# Patient Record
Sex: Male | Born: 1985 | Race: White | Hispanic: No | Marital: Married | Smoking: Never smoker
Health system: Southern US, Community
[De-identification: ages and names within clinical notes are randomized; demographics above are authoritative.]

---

## 2015-10-06 DIAGNOSIS — L0100 Impetigo, unspecified: Secondary | ICD-10-CM | POA: Diagnosis not present

## 2015-10-06 DIAGNOSIS — B001 Herpesviral vesicular dermatitis: Secondary | ICD-10-CM | POA: Diagnosis not present

## 2015-10-23 DIAGNOSIS — H5213 Myopia, bilateral: Secondary | ICD-10-CM | POA: Diagnosis not present

## 2015-12-13 DIAGNOSIS — D225 Melanocytic nevi of trunk: Secondary | ICD-10-CM | POA: Diagnosis not present

## 2015-12-13 DIAGNOSIS — D2239 Melanocytic nevi of other parts of face: Secondary | ICD-10-CM | POA: Diagnosis not present

## 2015-12-13 DIAGNOSIS — D485 Neoplasm of uncertain behavior of skin: Secondary | ICD-10-CM | POA: Diagnosis not present

## 2016-01-11 DIAGNOSIS — B009 Herpesviral infection, unspecified: Secondary | ICD-10-CM | POA: Diagnosis not present

## 2016-01-11 DIAGNOSIS — D485 Neoplasm of uncertain behavior of skin: Secondary | ICD-10-CM | POA: Diagnosis not present

## 2016-02-07 DIAGNOSIS — Z23 Encounter for immunization: Secondary | ICD-10-CM | POA: Diagnosis not present

## 2016-02-16 DIAGNOSIS — J019 Acute sinusitis, unspecified: Secondary | ICD-10-CM | POA: Diagnosis not present

## 2016-02-23 DIAGNOSIS — R1012 Left upper quadrant pain: Secondary | ICD-10-CM | POA: Diagnosis not present

## 2016-02-23 DIAGNOSIS — R300 Dysuria: Secondary | ICD-10-CM | POA: Diagnosis not present

## 2016-04-15 DIAGNOSIS — D485 Neoplasm of uncertain behavior of skin: Secondary | ICD-10-CM | POA: Diagnosis not present

## 2016-04-15 DIAGNOSIS — B009 Herpesviral infection, unspecified: Secondary | ICD-10-CM | POA: Diagnosis not present

## 2016-04-16 DIAGNOSIS — K640 First degree hemorrhoids: Secondary | ICD-10-CM | POA: Diagnosis not present

## 2016-05-23 DIAGNOSIS — J0100 Acute maxillary sinusitis, unspecified: Secondary | ICD-10-CM | POA: Diagnosis not present

## 2016-11-10 DIAGNOSIS — H5213 Myopia, bilateral: Secondary | ICD-10-CM | POA: Diagnosis not present

## 2016-11-12 DIAGNOSIS — R509 Fever, unspecified: Secondary | ICD-10-CM | POA: Diagnosis not present

## 2016-11-14 DIAGNOSIS — R509 Fever, unspecified: Secondary | ICD-10-CM | POA: Diagnosis not present

## 2016-11-14 DIAGNOSIS — A932 Colorado tick fever: Secondary | ICD-10-CM | POA: Diagnosis not present

## 2016-12-16 DIAGNOSIS — D485 Neoplasm of uncertain behavior of skin: Secondary | ICD-10-CM | POA: Diagnosis not present

## 2017-01-28 DIAGNOSIS — Z23 Encounter for immunization: Secondary | ICD-10-CM | POA: Diagnosis not present

## 2017-04-30 DIAGNOSIS — Z1331 Encounter for screening for depression: Secondary | ICD-10-CM | POA: Diagnosis not present

## 2017-04-30 DIAGNOSIS — Z1339 Encounter for screening examination for other mental health and behavioral disorders: Secondary | ICD-10-CM | POA: Diagnosis not present

## 2017-04-30 DIAGNOSIS — Z6820 Body mass index (BMI) 20.0-20.9, adult: Secondary | ICD-10-CM | POA: Diagnosis not present

## 2017-04-30 DIAGNOSIS — Z0000 Encounter for general adult medical examination without abnormal findings: Secondary | ICD-10-CM | POA: Diagnosis not present

## 2017-08-26 DIAGNOSIS — Z6820 Body mass index (BMI) 20.0-20.9, adult: Secondary | ICD-10-CM | POA: Diagnosis not present

## 2017-08-26 DIAGNOSIS — Z0289 Encounter for other administrative examinations: Secondary | ICD-10-CM | POA: Diagnosis not present

## 2017-12-15 DIAGNOSIS — D485 Neoplasm of uncertain behavior of skin: Secondary | ICD-10-CM | POA: Diagnosis not present

## 2017-12-15 DIAGNOSIS — D225 Melanocytic nevi of trunk: Secondary | ICD-10-CM | POA: Diagnosis not present

## 2017-12-15 DIAGNOSIS — D2239 Melanocytic nevi of other parts of face: Secondary | ICD-10-CM | POA: Diagnosis not present

## 2018-01-04 DIAGNOSIS — K649 Unspecified hemorrhoids: Secondary | ICD-10-CM | POA: Diagnosis not present

## 2018-01-20 DIAGNOSIS — Z23 Encounter for immunization: Secondary | ICD-10-CM | POA: Diagnosis not present

## 2019-03-15 DIAGNOSIS — J029 Acute pharyngitis, unspecified: Secondary | ICD-10-CM | POA: Diagnosis not present

## 2019-06-13 DIAGNOSIS — J019 Acute sinusitis, unspecified: Secondary | ICD-10-CM | POA: Diagnosis not present

## 2019-06-13 DIAGNOSIS — Z20828 Contact with and (suspected) exposure to other viral communicable diseases: Secondary | ICD-10-CM | POA: Diagnosis not present

## 2019-06-13 DIAGNOSIS — J029 Acute pharyngitis, unspecified: Secondary | ICD-10-CM | POA: Diagnosis not present

## 2019-07-21 DIAGNOSIS — Z1331 Encounter for screening for depression: Secondary | ICD-10-CM | POA: Diagnosis not present

## 2019-07-21 DIAGNOSIS — Z0000 Encounter for general adult medical examination without abnormal findings: Secondary | ICD-10-CM | POA: Diagnosis not present

## 2019-07-21 DIAGNOSIS — Z13220 Encounter for screening for lipoid disorders: Secondary | ICD-10-CM | POA: Diagnosis not present

## 2019-07-21 DIAGNOSIS — Z681 Body mass index (BMI) 19 or less, adult: Secondary | ICD-10-CM | POA: Diagnosis not present

## 2019-07-21 DIAGNOSIS — K644 Residual hemorrhoidal skin tags: Secondary | ICD-10-CM | POA: Diagnosis not present

## 2019-10-24 DIAGNOSIS — J324 Chronic pansinusitis: Secondary | ICD-10-CM | POA: Diagnosis not present

## 2020-01-02 DIAGNOSIS — Z23 Encounter for immunization: Secondary | ICD-10-CM | POA: Diagnosis not present

## 2020-01-30 DIAGNOSIS — Z23 Encounter for immunization: Secondary | ICD-10-CM | POA: Diagnosis not present

## 2020-02-23 DIAGNOSIS — J069 Acute upper respiratory infection, unspecified: Secondary | ICD-10-CM | POA: Diagnosis not present

## 2020-02-23 DIAGNOSIS — M25722 Osteophyte, left elbow: Secondary | ICD-10-CM | POA: Diagnosis not present

## 2021-04-10 DIAGNOSIS — Z1331 Encounter for screening for depression: Secondary | ICD-10-CM | POA: Diagnosis not present

## 2021-04-10 DIAGNOSIS — Z0000 Encounter for general adult medical examination without abnormal findings: Secondary | ICD-10-CM | POA: Diagnosis not present

## 2021-04-10 DIAGNOSIS — Z13220 Encounter for screening for lipoid disorders: Secondary | ICD-10-CM | POA: Diagnosis not present

## 2021-04-10 DIAGNOSIS — Z6820 Body mass index (BMI) 20.0-20.9, adult: Secondary | ICD-10-CM | POA: Diagnosis not present

## 2021-04-29 ENCOUNTER — Other Ambulatory Visit: Payer: Self-pay

## 2021-04-29 ENCOUNTER — Encounter (HOSPITAL_COMMUNITY): Payer: Self-pay | Admitting: *Deleted

## 2021-04-29 ENCOUNTER — Emergency Department (HOSPITAL_COMMUNITY)
Admission: EM | Admit: 2021-04-29 | Discharge: 2021-04-30 | Disposition: A | Discharge status: Home / Self Care | Payer: BC Managed Care – PPO | Attending: Emergency Medicine | Admitting: Emergency Medicine

## 2021-04-29 DIAGNOSIS — K610 Anal abscess: Secondary | ICD-10-CM | POA: Diagnosis not present

## 2021-04-29 DIAGNOSIS — K611 Rectal abscess: Secondary | ICD-10-CM | POA: Diagnosis not present

## 2021-04-29 DIAGNOSIS — N3289 Other specified disorders of bladder: Secondary | ICD-10-CM | POA: Diagnosis not present

## 2021-04-29 DIAGNOSIS — K6289 Other specified diseases of anus and rectum: Secondary | ICD-10-CM | POA: Diagnosis not present

## 2021-04-29 NOTE — ED Provider Notes (Signed)
Emergency Medicine Provider Triage Evaluation Note  Jonathon Gardner , a 35 y.o. male  was evaluated in triage.  Pt complains of rectal abscess. 5 days. No rectal bleeding. No fever, chills. Sent by urgent care because they were not comfortable draining it. No recent abx. No hx of same.  Review of Systems  Positive: abscess Negative: Fever, chills  Physical Exam  BP 126/79 (BP Location: Right Arm)    Pulse 74    Temp 98 F (36.7 C) (Oral)    Resp 15    SpO2 100%  Gen:   Awake, no distress   Resp:  Normal effort  MSK:   Moves extremities without difficulty  Other:  Swollen mass noted perineum, no active draining  Medical Decision Making  Medically screening exam initiated at 5:05 PM.  Appropriate orders placed.  Jonathon Gardner was informed that the remainder of the evaluation will be completed by another provider, this initial triage assessment does not replace that evaluation, and the importance of remaining in the ED until their evaluation is complete.  abscess   Jonathon Gardner 04/29/21 1706    Jonathon Kaplan, MD 04/29/21 2329

## 2021-04-29 NOTE — ED Triage Notes (Signed)
Pt reports rectal pain since last week. Denies rectal bleeding. Went to an ucc today thinking that he had hemorrhoids, they sent him here for rectal abscess and possible drainage. Denies fever. No acute distress noted.

## 2021-04-30 ENCOUNTER — Emergency Department (HOSPITAL_COMMUNITY): Payer: BC Managed Care – PPO

## 2021-04-30 DIAGNOSIS — K611 Rectal abscess: Secondary | ICD-10-CM | POA: Diagnosis not present

## 2021-04-30 DIAGNOSIS — K610 Anal abscess: Secondary | ICD-10-CM | POA: Diagnosis not present

## 2021-04-30 DIAGNOSIS — K6289 Other specified diseases of anus and rectum: Secondary | ICD-10-CM | POA: Diagnosis not present

## 2021-04-30 DIAGNOSIS — N3289 Other specified disorders of bladder: Secondary | ICD-10-CM | POA: Diagnosis not present

## 2021-04-30 LAB — I-STAT CHEM 8, ED
BUN: 6 mg/dL (ref 6–20)
Calcium, Ion: 1.18 mmol/L (ref 1.15–1.40)
Chloride: 103 mmol/L (ref 98–111)
Creatinine, Ser: 0.7 mg/dL (ref 0.61–1.24)
Glucose, Bld: 101 mg/dL — ABNORMAL HIGH (ref 70–99)
HCT: 44 % (ref 39.0–52.0)
Hemoglobin: 15 g/dL (ref 13.0–17.0)
Potassium: 3.3 mmol/L — ABNORMAL LOW (ref 3.5–5.1)
Sodium: 140 mmol/L (ref 135–145)
TCO2: 27 mmol/L (ref 22–32)

## 2021-04-30 LAB — BASIC METABOLIC PANEL
Anion gap: 10 (ref 5–15)
BUN: 6 mg/dL (ref 6–20)
CO2: 24 mmol/L (ref 22–32)
Calcium: 8.8 mg/dL — ABNORMAL LOW (ref 8.9–10.3)
Chloride: 105 mmol/L (ref 98–111)
Creatinine, Ser: 0.76 mg/dL (ref 0.61–1.24)
GFR, Estimated: >60 mL/min (ref >60)
Glucose, Bld: 101 mg/dL — ABNORMAL HIGH (ref 70–99)
Potassium: 3.4 mmol/L — ABNORMAL LOW (ref 3.5–5.1)
Sodium: 139 mmol/L (ref 135–145)

## 2021-04-30 LAB — CBC WITH DIFFERENTIAL/PLATELET
Abs Immature Granulocytes: 0.03 10*3/uL (ref 0.00–0.07)
Basophils Absolute: 0 10*3/uL (ref 0.0–0.1)
Basophils Relative: 0 %
Eosinophils Absolute: 0.2 10*3/uL (ref 0.0–0.5)
Eosinophils Relative: 1 %
HCT: 41.4 % (ref 39.0–52.0)
Hemoglobin: 14 g/dL (ref 13.0–17.0)
Immature Granulocytes: 0 %
Lymphocytes Relative: 9 %
Lymphs Abs: 1.1 10*3/uL (ref 0.7–4.0)
MCH: 30 pg (ref 26.0–34.0)
MCHC: 33.8 g/dL (ref 30.0–36.0)
MCV: 88.8 fL (ref 80.0–100.0)
Monocytes Absolute: 0.9 10*3/uL (ref 0.1–1.0)
Monocytes Relative: 8 %
Neutro Abs: 10 10*3/uL — ABNORMAL HIGH (ref 1.7–7.7)
Neutrophils Relative %: 82 %
Platelets: 207 10*3/uL (ref 150–400)
RBC: 4.66 MIL/uL (ref 4.22–5.81)
RDW: 12.3 % (ref 11.5–15.5)
WBC: 12.3 10*3/uL — ABNORMAL HIGH (ref 4.0–10.5)
nRBC: 0 % (ref 0.0–0.2)

## 2021-04-30 MED ORDER — LIDOCAINE-EPINEPHRINE (PF) 2 %-1:200000 IJ SOLN
20.0000 mL | Freq: Once | INTRAMUSCULAR | Status: AC
  Administered 2021-04-30: 20 mL
  Filled 2021-04-30: qty 20

## 2021-04-30 MED ORDER — DOXYCYCLINE HYCLATE 100 MG PO CAPS: 100.0000 mg | ORAL_CAPSULE | Freq: Two times a day (BID) | ORAL | 0 refills | Status: DC

## 2021-04-30 MED ORDER — ACETAMINOPHEN 500 MG PO TABS
1000.0000 mg | ORAL_TABLET | Freq: Four times a day (QID) | ORAL | Status: DC | PRN
  Administered 2021-04-30: 02:00:00 1000 mg via ORAL
  Filled 2021-04-30: qty 2

## 2021-04-30 MED ORDER — IOHEXOL 300 MG/ML  SOLN
100.0000 mL | Freq: Once | INTRAMUSCULAR | Status: AC | PRN
  Administered 2021-04-30: 01:00:00 100 mL via INTRAVENOUS

## 2021-04-30 MED ORDER — LIDOCAINE-EPINEPHRINE-TETRACAINE (LET) TOPICAL GEL
3.0000 mL | Freq: Once | TOPICAL | Status: AC
  Administered 2021-04-30: 02:00:00 3 mL via TOPICAL
  Filled 2021-04-30: qty 3

## 2021-04-30 NOTE — ED Notes (Signed)
C/o headache spoke with provider

## 2021-04-30 NOTE — ED Notes (Signed)
Pt in ct at this time ° °

## 2021-04-30 NOTE — Discharge Instructions (Signed)
You were seen today for a perianal abscess.  This was drained.  Keep packing in place for 2 to 3 days.  Take antibiotics as prescribed.  Start a stool softener such as Colace to make sure your bowels are soft.  You may also start MiraLAX.  Follow-up with general surgery if you have any new or worsening symptoms.

## 2021-04-30 NOTE — ED Notes (Signed)
C/o rectal pain for at least a week. Seen at [facility] and sent here due to rectum abscess.

## 2021-04-30 NOTE — ED Notes (Signed)
PT back from CT

## 2021-04-30 NOTE — ED Notes (Signed)
Pt not in room at this time

## 2021-04-30 NOTE — ED Provider Notes (Signed)
[facility] EMERGENCY DEPARTMENT Provider Note   CSN: [id number] Arrival date & time: 04/29/21  1436     History Chief Complaint  Patient presents with   Abscess    Jonathon Gardner is a 35 y.o. male.  HPI     This is a 35 year old male who presents with concerns for perirectal abscess.  Patient was seen and evaluated in urgent care for the same.  Patient states that since mid last week he has noted pain in the rectal area.  He thought he had hemorrhoids.  He has not noted any drainage or bleeding.  He has never had perirectal abscess before.  He has not had any fevers or systemic symptoms.  Urgent care evaluated him and felt that he needed higher level of care.  He reports pain mostly with sitting.  History reviewed. No pertinent past medical history.  There are no problems to display for this patient.   History reviewed. No pertinent surgical history.     History reviewed. No pertinent family history.  Social History   Tobacco Use   Smoking status: Never   Smokeless tobacco: Never  Substance Use Topics   Alcohol use: Yes    Comment: socially   Drug use: Never    Home Medications Prior to Admission medications   Medication Sig Start Date End Date Taking? Authorizing Provider  doxycycline (VIBRAMYCIN) 100 MG capsule Take 1 capsule (100 mg total) by mouth 2 (two) times daily. 04/30/21  Yes Lysandra Loughmiller, Mayer Masker, MD    Allergies    Penicillins  Review of Systems   Review of Systems  Constitutional:  Negative for fever.  Respiratory:  Negative for shortness of breath.   Cardiovascular:  Negative for chest pain.  Gastrointestinal:  Positive for rectal pain. Negative for abdominal pain, diarrhea, nausea and vomiting.  Genitourinary:  Negative for dysuria.  All other systems reviewed and are negative.  Physical Exam Updated Vital Signs BP 123/81 (BP Location: Right Arm)    Pulse 79    Temp 98.1 F (36.7 C) (Oral)    Resp 16    Ht 1.854 m (6\' 1" )     Wt 74.8 kg    SpO2 100%    BMI 21.77 kg/m   Physical Exam Vitals and nursing note reviewed.  Constitutional:      Appearance: He is well-developed. He is not ill-appearing.  HENT:     Head: Normocephalic and atraumatic.     Nose: Nose normal.     Mouth/Throat:     Mouth: Mucous membranes are moist.  Eyes:     Pupils: Pupils are equal, round, and reactive to light.  Cardiovascular:     Rate and Rhythm: Normal rate and regular rhythm.  Pulmonary:     Effort: Pulmonary effort is normal. No respiratory distress.  Abdominal:     Tenderness: There is no abdominal tenderness.  Genitourinary:   Musculoskeletal:     Cervical back: Neck supple.  Lymphadenopathy:     Cervical: No cervical adenopathy.  Skin:    General: Skin is warm and dry.  Neurological:     Mental Status: He is alert and oriented to person, place, and time.  Psychiatric:        Mood and Affect: Mood normal.    ED Results / Procedures / Treatments   Labs (all labs ordered are listed, but only abnormal results are displayed) Labs Reviewed  BASIC METABOLIC PANEL - Abnormal; Notable for the following components:  Result Value   Potassium 3.4 (*)    Glucose, Bld 101 (*)    Calcium 8.8 (*)    All other components within normal limits  CBC WITH DIFFERENTIAL/PLATELET - Abnormal; Notable for the following components:   WBC 12.3 (*)    Neutro Abs 10.0 (*)    All other components within normal limits  I-STAT CHEM 8, ED - Abnormal; Notable for the following components:   Potassium 3.3 (*)    Glucose, Bld 101 (*)    All other components within normal limits  CBC WITH DIFFERENTIAL/PLATELET    EKG None  Radiology CT PELVIS W CONTRAST  Result Date: 04/30/2021 CLINICAL DATA:  Rectal pain for 1 week, initial encounter EXAM: CT PELVIS WITH CONTRAST TECHNIQUE: Multidetector CT imaging of the pelvis was performed using the standard protocol following the bolus administration of intravenous contrast. CONTRAST:   OMNIPAQUE IOHEXOL 300 MG/ML  SOLN COMPARISON:  None. FINDINGS: Urinary Tract: Bladder is well distended. Kidneys are not well visualized on this exam. Bowel:  No obstructive or inflammatory changes are noted. Vascular/Lymphatic: No pathologically enlarged lymph nodes. No significant vascular abnormality seen. Reproductive:  Prostate is within normal limits. Other: Along the left lateral aspect of the anus best seen on image number 53 of series 4 there is a 10 mm hypodensity with surrounding enhancement and inflammatory change consistent with a small perianal abscess. This measures approximately 15 mm in craniocaudad projection. Musculoskeletal: No suspicious bone lesions identified. IMPRESSION: Small left perianal abscess as described. Electronically Signed   By: Alcide Clever M.D.   On: 04/30/2021 01:30    Procedures .Marland KitchenIncision and Drainage  Date/Time: 04/30/2021 3:06 AM Performed by: Shon Baton, MD Authorized by: Shon Baton, MD   Consent:    Consent obtained:  Verbal Universal protocol:    Patient identity confirmed:  Verbally with patient Location:    Type:  Abscess   Size:  1 cmX 1 cm   Location:  Anogenital   Anogenital location:  Perirectal Sedation:    Sedation type:  None Anesthesia:    Anesthesia method:  Local infiltration   Local anesthetic:  Lidocaine 2% WITH epi Procedure type:    Complexity:  Simple Procedure details:    Ultrasound guidance: yes     Incision types:  Stab incision   Incision depth:  Dermal   Wound management:  Probed and deloculated   Drainage:  Purulent   Drainage amount:  Moderate   Packing materials:  1/4 in iodoform gauze Post-procedure details:    Procedure completion:  Tolerated   Medications Ordered in ED Medications  acetaminophen (TYLENOL) tablet 1,000 mg (1,000 mg Oral Given 04/30/21 0204)  lidocaine-EPINEPHrine-tetracaine (LET) topical gel (3 mLs Topical Given 04/30/21 0133)  iohexol (OMNIPAQUE) 300 MG/ML solution  100 mL (100 mLs Intravenous Contrast Given 04/30/21 0126)  lidocaine-EPINEPHrine (XYLOCAINE W/EPI) 2 %-1:200000 (PF) injection 20 mL (20 mLs Infiltration Given 04/30/21 0200)    ED Course  I have reviewed the triage vital signs and the nursing notes.  Pertinent labs & imaging results that were available during my care of the patient were reviewed by me and considered in my medical decision making (see chart for details).    MDM Rules/Calculators/A&P                          Patient presents with concerns for perianal/perirectal abscess.  Clinically this is what it appears to be.  No history of the  same.  He is clinically well-appearing.  He is afebrile.  Vital signs are reassuring.  I did obtain a CT to ensure no deep penetration of the abscess or fistulization.  There does not appear to be any complications.  This was drained at the bedside without difficulty.  Patient was given return precautions.  He was given surgery follow-up.  He was given care instructions.  After history, exam, and medical workup I feel the patient has been appropriately medically screened and is safe for discharge home. Pertinent diagnoses were discussed with the patient. Patient was given return precautions.      Final Clinical Impression(s) / ED Diagnoses Final diagnoses:  Perianal abscess    Rx / DC Orders ED Discharge Orders          Ordered    doxycycline (VIBRAMYCIN) 100 MG capsule  2 times daily        04/30/21 0256             Rickelle Sylvestre, Mayer Masker, MD 04/30/21 [phone]

## 2021-04-30 NOTE — ED Notes (Signed)
Ambulated to restroom without assistance 

## 2021-08-16 DIAGNOSIS — K610 Anal abscess: Secondary | ICD-10-CM | POA: Diagnosis not present

## 2021-08-16 DIAGNOSIS — Z6820 Body mass index (BMI) 20.0-20.9, adult: Secondary | ICD-10-CM | POA: Diagnosis not present

## 2021-08-21 DIAGNOSIS — K610 Anal abscess: Secondary | ICD-10-CM | POA: Diagnosis not present

## 2021-10-10 DIAGNOSIS — K649 Unspecified hemorrhoids: Secondary | ICD-10-CM | POA: Diagnosis not present

## 2021-10-10 DIAGNOSIS — K219 Gastro-esophageal reflux disease without esophagitis: Secondary | ICD-10-CM | POA: Diagnosis not present

## 2021-10-10 DIAGNOSIS — Z8719 Personal history of other diseases of the digestive system: Secondary | ICD-10-CM | POA: Diagnosis not present

## 2021-10-10 DIAGNOSIS — K5900 Constipation, unspecified: Secondary | ICD-10-CM | POA: Diagnosis not present

## 2021-10-17 DIAGNOSIS — K603 Anal fistula: Secondary | ICD-10-CM | POA: Diagnosis not present

## 2021-10-25 DIAGNOSIS — R0981 Nasal congestion: Secondary | ICD-10-CM | POA: Diagnosis not present

## 2021-10-25 DIAGNOSIS — J019 Acute sinusitis, unspecified: Secondary | ICD-10-CM | POA: Diagnosis not present

## 2021-11-20 DIAGNOSIS — K649 Unspecified hemorrhoids: Secondary | ICD-10-CM | POA: Diagnosis not present

## 2021-11-20 DIAGNOSIS — K5900 Constipation, unspecified: Secondary | ICD-10-CM | POA: Diagnosis not present

## 2021-11-20 DIAGNOSIS — K219 Gastro-esophageal reflux disease without esophagitis: Secondary | ICD-10-CM | POA: Diagnosis not present

## 2022-01-06 DIAGNOSIS — R0981 Nasal congestion: Secondary | ICD-10-CM | POA: Diagnosis not present

## 2022-01-06 DIAGNOSIS — J209 Acute bronchitis, unspecified: Secondary | ICD-10-CM | POA: Diagnosis not present

## 2022-01-06 DIAGNOSIS — R051 Acute cough: Secondary | ICD-10-CM | POA: Diagnosis not present

## 2022-01-12 DIAGNOSIS — J0100 Acute maxillary sinusitis, unspecified: Secondary | ICD-10-CM | POA: Diagnosis not present

## 2022-01-12 DIAGNOSIS — R051 Acute cough: Secondary | ICD-10-CM | POA: Diagnosis not present

## 2022-02-28 DIAGNOSIS — J309 Allergic rhinitis, unspecified: Secondary | ICD-10-CM | POA: Diagnosis not present

## 2022-02-28 DIAGNOSIS — Z6820 Body mass index (BMI) 20.0-20.9, adult: Secondary | ICD-10-CM | POA: Diagnosis not present

## 2022-04-26 DIAGNOSIS — L0291 Cutaneous abscess, unspecified: Secondary | ICD-10-CM | POA: Diagnosis not present

## 2022-04-26 DIAGNOSIS — L0293 Carbuncle, unspecified: Secondary | ICD-10-CM | POA: Diagnosis not present

## 2022-04-26 DIAGNOSIS — K610 Anal abscess: Secondary | ICD-10-CM | POA: Diagnosis not present

## 2022-08-21 DIAGNOSIS — K603 Anal fistula: Secondary | ICD-10-CM | POA: Diagnosis not present

## 2022-09-12 DIAGNOSIS — K603 Anal fistula: Secondary | ICD-10-CM | POA: Diagnosis not present

## 2022-09-12 DIAGNOSIS — K600 Acute anal fissure: Secondary | ICD-10-CM | POA: Diagnosis not present

## 2022-09-12 DIAGNOSIS — K610 Anal abscess: Secondary | ICD-10-CM | POA: Diagnosis not present

## 2022-09-17 DIAGNOSIS — K603 Anal fistula: Secondary | ICD-10-CM | POA: Diagnosis not present

## 2022-09-17 DIAGNOSIS — Z09 Encounter for follow-up examination after completed treatment for conditions other than malignant neoplasm: Secondary | ICD-10-CM | POA: Diagnosis not present

## 2022-10-15 DIAGNOSIS — Z09 Encounter for follow-up examination after completed treatment for conditions other than malignant neoplasm: Secondary | ICD-10-CM | POA: Diagnosis not present

## 2022-10-15 DIAGNOSIS — K603 Anal fistula: Secondary | ICD-10-CM | POA: Diagnosis not present

## 2022-11-12 DIAGNOSIS — K603 Anal fistula: Secondary | ICD-10-CM | POA: Diagnosis not present

## 2022-11-12 DIAGNOSIS — Z09 Encounter for follow-up examination after completed treatment for conditions other than malignant neoplasm: Secondary | ICD-10-CM | POA: Diagnosis not present

## 2022-12-24 DIAGNOSIS — K603 Anal fistula: Secondary | ICD-10-CM | POA: Diagnosis not present

## 2023-01-28 DIAGNOSIS — K603 Anal fistula: Secondary | ICD-10-CM | POA: Diagnosis not present

## 2023-02-12 DIAGNOSIS — K603 Anal fistula, unspecified: Secondary | ICD-10-CM | POA: Diagnosis not present

## 2023-02-25 DIAGNOSIS — K603 Anal fistula, unspecified: Secondary | ICD-10-CM | POA: Diagnosis not present

## 2023-04-08 DIAGNOSIS — Z09 Encounter for follow-up examination after completed treatment for conditions other than malignant neoplasm: Secondary | ICD-10-CM | POA: Diagnosis not present

## 2023-04-08 DIAGNOSIS — K603 Anal fistula, unspecified: Secondary | ICD-10-CM | POA: Diagnosis not present

## 2023-05-10 IMAGING — CT CT PELVIS W/ CM
2 of 3 series · 17 of 46 positions shown, 19 images · IV contrast (omnipaque)
Comparison: None.

CLINICAL DATA: Rectal pain for 1 week, initial encounter

EXAM:
CT PELVIS WITH CONTRAST
TECHNIQUE: Multidetector CT imaging of the pelvis was performed using the
standard protocol following the bolus administration of intravenous
contrast.
CONTRAST:  100mL OMNIPAQUE IOHEXOL 300 MG/ML  SOLN

[Series 4: pelvis with 5.0 · axial · 0.93mm/px · z∈[+599,+889]mm · 14 of 68 slices shown, 16 images]
[im 5/68  soft-tissue]
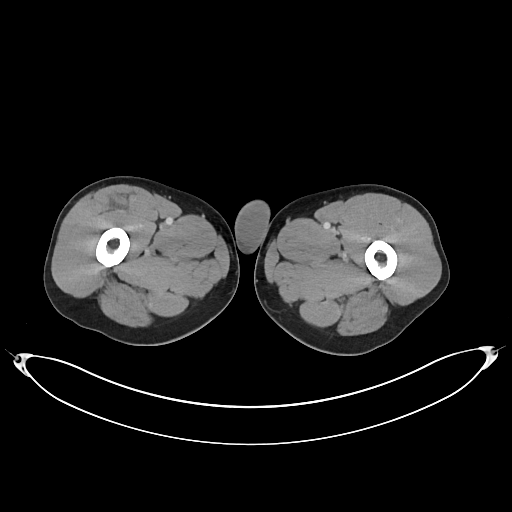
[im 5/68  bone]
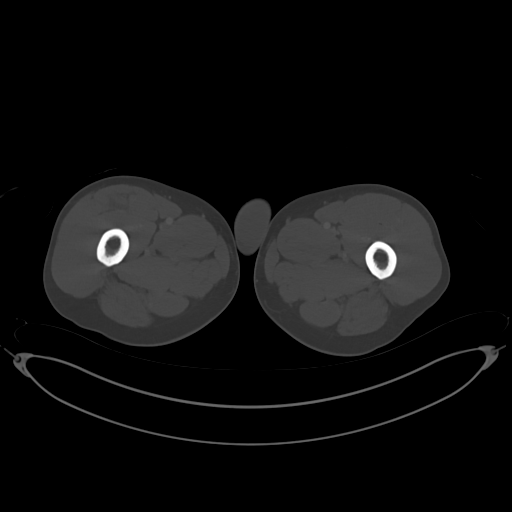
[im 9/68  soft-tissue]
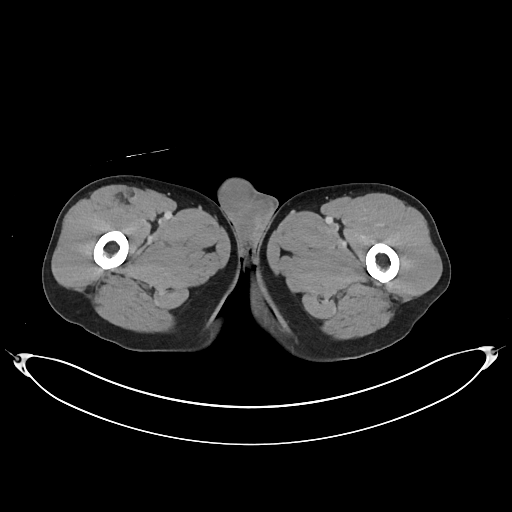
[im 13/68  soft-tissue]
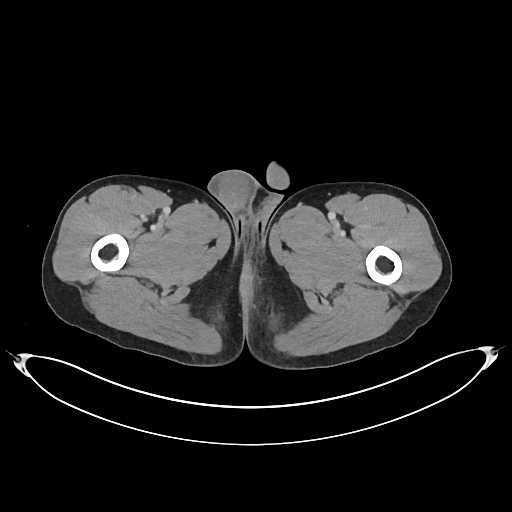
[im 18/68  soft-tissue]
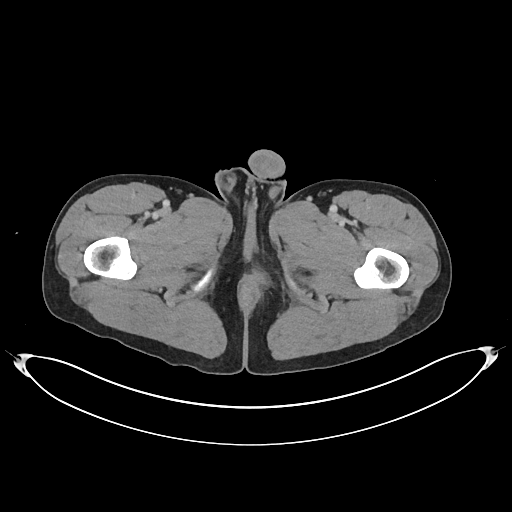
[im 22/68  soft-tissue]
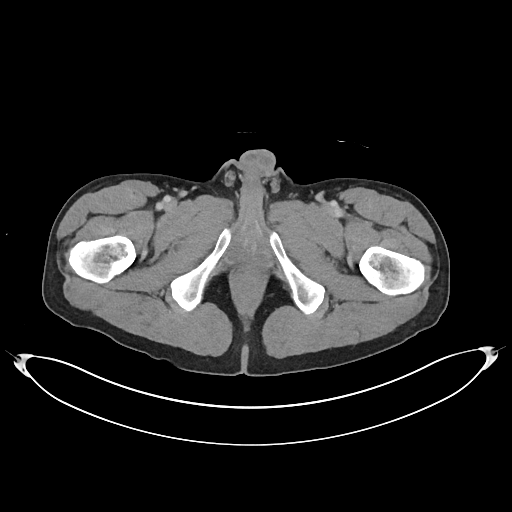
[im 26/68  soft-tissue]
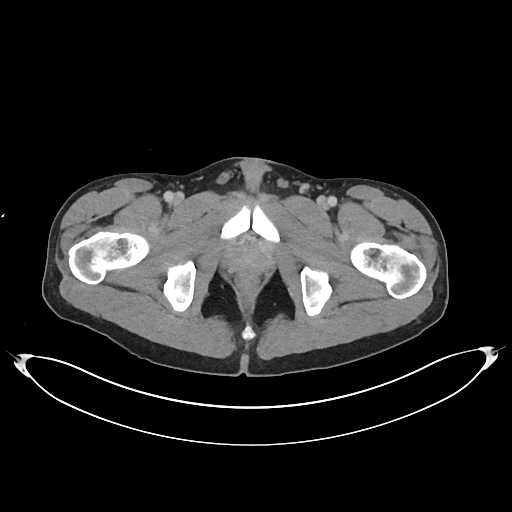
[im 31/68  soft-tissue]
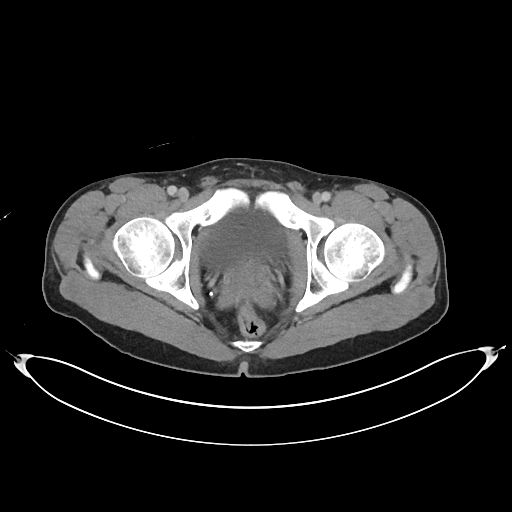
[im 37/68  soft-tissue]
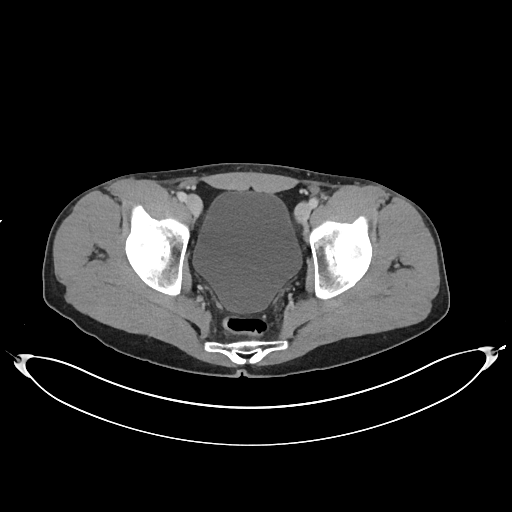
[im 42/68  soft-tissue]
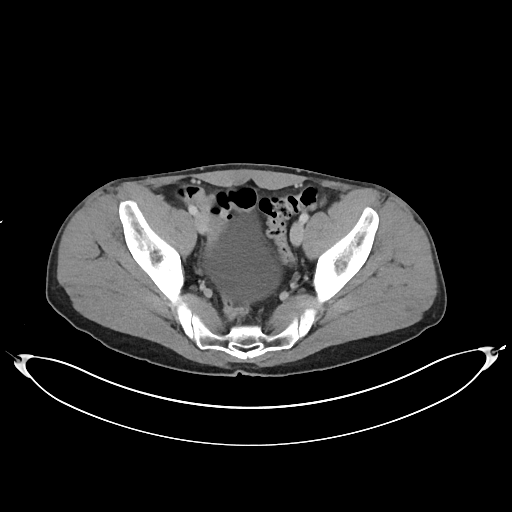
[im 42/68  bone]
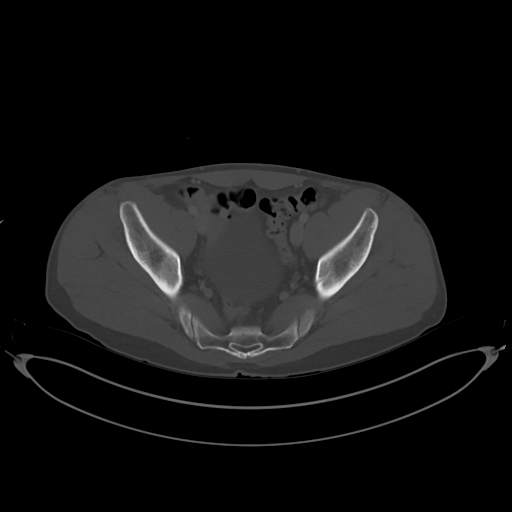
[im 46/68  soft-tissue]
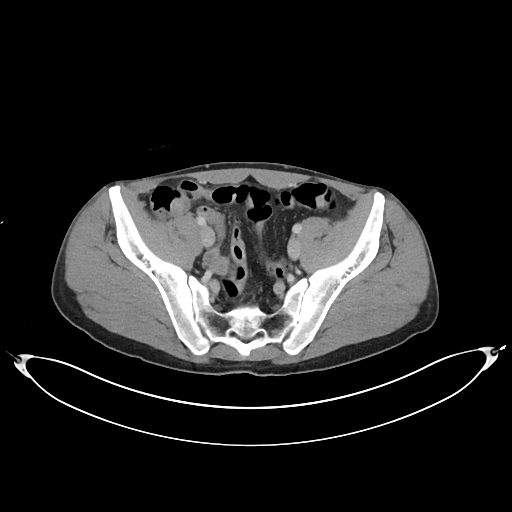
[im 50/68  soft-tissue]
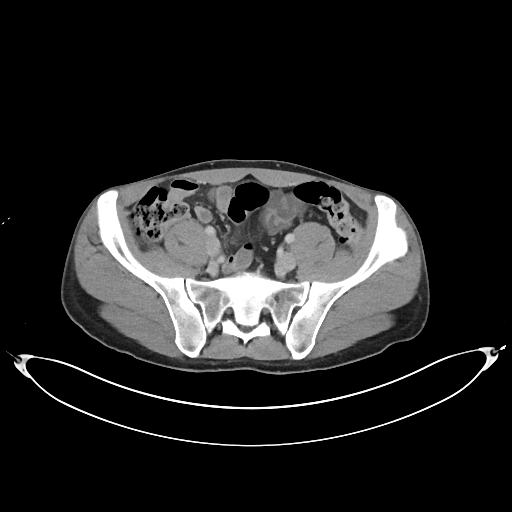
[im 55/68  soft-tissue]
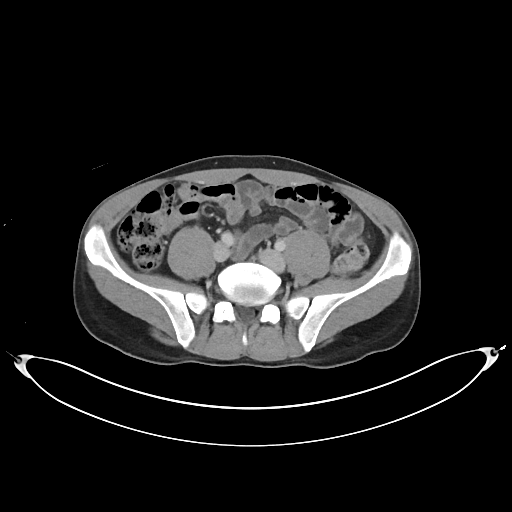
[im 59/68  soft-tissue]
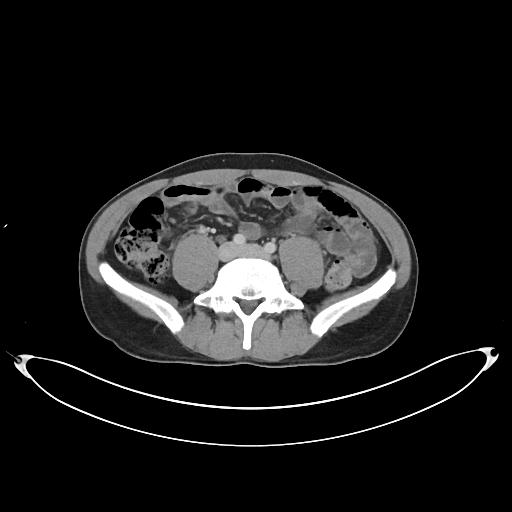
[im 63/68  soft-tissue]
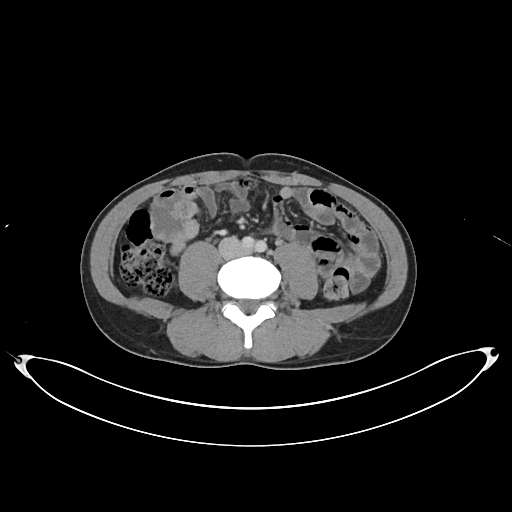

[Series 6: pelvis with 2.0 cor · coronal · 0.67mm/px · 3 of 142 slices shown]
[im 48/142  soft-tissue]
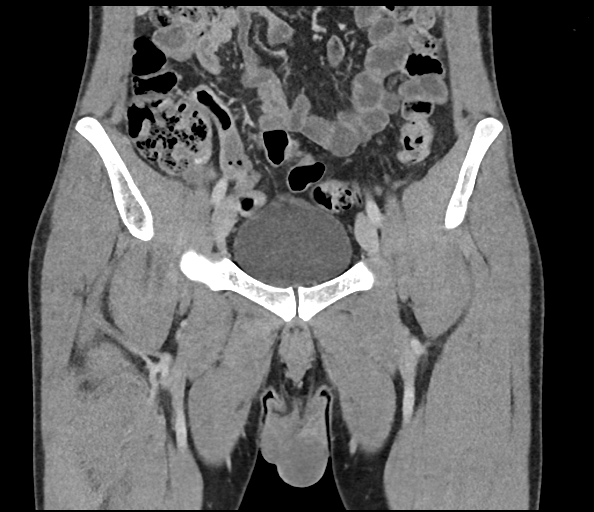
[im 63/142  soft-tissue]
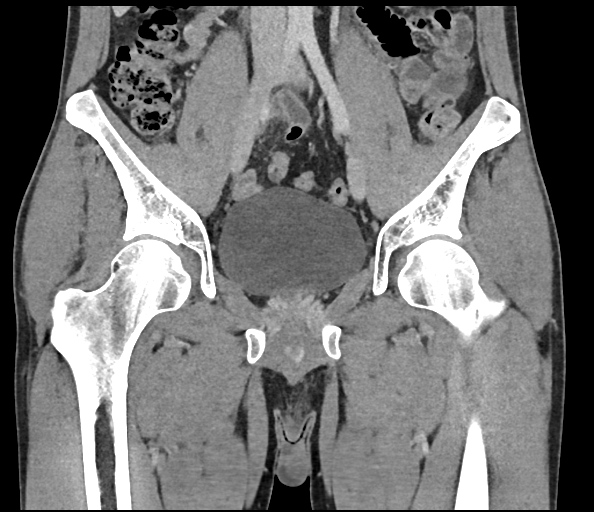
[im 79/142  soft-tissue]
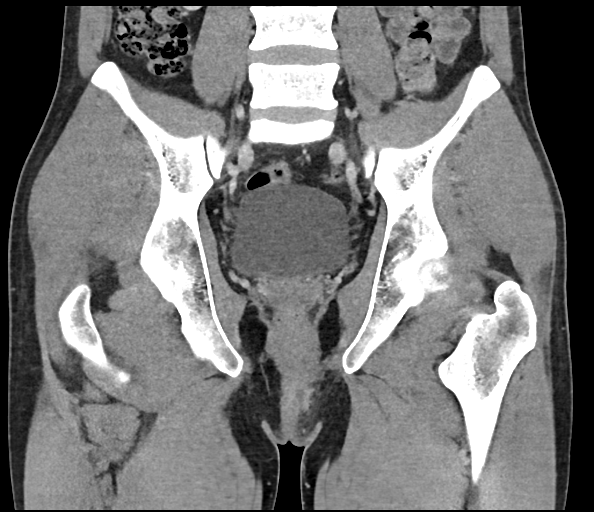

[17 of 46 positions shown; findings below may reference images not displayed]

FINDINGS: Urinary Tract: Bladder is well distended. Kidneys are not well
visualized on this exam.

Bowel:  No obstructive or inflammatory changes are noted.

Vascular/Lymphatic: No pathologically enlarged lymph nodes. No
significant vascular abnormality seen.

Reproductive:  Prostate is within normal limits.

Other: Along the left lateral aspect of the anus best seen on image
number 53 of series 4 there is a 10 mm hypodensity with surrounding
enhancement and inflammatory change consistent with a small perianal
abscess. This measures approximately 15 mm in craniocaudad
projection.

Musculoskeletal: No suspicious bone lesions identified.
IMPRESSION: Small left perianal abscess as described.

## 2023-05-14 DIAGNOSIS — K603 Anal fistula, unspecified: Secondary | ICD-10-CM | POA: Diagnosis not present

## 2023-06-03 DIAGNOSIS — K603 Anal fistula, unspecified: Secondary | ICD-10-CM | POA: Diagnosis not present

## 2023-06-11 DIAGNOSIS — K603 Anal fistula, unspecified: Secondary | ICD-10-CM | POA: Diagnosis not present

## 2023-06-14 DIAGNOSIS — R1084 Generalized abdominal pain: Secondary | ICD-10-CM | POA: Diagnosis not present

## 2023-06-16 DIAGNOSIS — R1084 Generalized abdominal pain: Secondary | ICD-10-CM | POA: Diagnosis not present

## 2023-06-16 DIAGNOSIS — Z6821 Body mass index (BMI) 21.0-21.9, adult: Secondary | ICD-10-CM | POA: Diagnosis not present

## 2023-06-25 DIAGNOSIS — K603 Anal fistula, unspecified: Secondary | ICD-10-CM | POA: Diagnosis not present

## 2023-06-25 DIAGNOSIS — Z09 Encounter for follow-up examination after completed treatment for conditions other than malignant neoplasm: Secondary | ICD-10-CM | POA: Diagnosis not present

## 2024-02-06 ENCOUNTER — Ambulatory Visit (HOSPITAL_BASED_OUTPATIENT_CLINIC_OR_DEPARTMENT_OTHER)
Admission: EM | Admit: 2024-02-06 | Discharge: 2024-02-06 | Disposition: A | Discharge status: Home / Self Care | Attending: Family Medicine | Admitting: Family Medicine

## 2024-02-06 ENCOUNTER — Encounter (HOSPITAL_BASED_OUTPATIENT_CLINIC_OR_DEPARTMENT_OTHER): Payer: Self-pay | Admitting: Emergency Medicine

## 2024-02-06 DIAGNOSIS — J3489 Other specified disorders of nose and nasal sinuses: Secondary | ICD-10-CM

## 2024-02-06 DIAGNOSIS — J0140 Acute pansinusitis, unspecified: Secondary | ICD-10-CM | POA: Diagnosis not present

## 2024-02-06 MED ORDER — DOXYCYCLINE HYCLATE 100 MG PO CAPS: 100.0000 mg | ORAL_CAPSULE | Freq: Two times a day (BID) | ORAL | 0 refills | Status: AC

## 2024-02-06 MED ORDER — TRIAMCINOLONE ACETONIDE 40 MG/ML IJ SUSP
40.0000 mg | Freq: Once | INTRAMUSCULAR | Status: AC
  Administered 2024-02-06: 40 mg via INTRAMUSCULAR

## 2024-02-06 NOTE — Discharge Instructions (Addendum)
 Sinusitis with sinus pressure: Kenalog 40 mg injection in the office (this is a steroid injection).  Doxycycline  100 mg twice daily.  Use plain Mucinex to make nasal secretions thinner.  Discontinue use of OTC Sudafed.  Get plenty of fluids and rest.  Encouraged use of sinus rinse bottle kit twice daily.  Follow-up if symptoms do not improve, worsen or new symptoms occur.

## 2024-02-06 NOTE — ED Triage Notes (Addendum)
 Pt st's he has a sinus infection.  C/O nasal congestion with yellow mucus and slight cough  Pt st's he has been taking Sudafed with only little relief  St's he feels pressure under his eyes

## 2024-02-06 NOTE — ED Provider Notes (Signed)
 [facility]    CSN: [id number] Arrival date & time: 02/06/24  9160      History   Chief Complaint Chief Complaint  Patient presents with   Nasal congestion    HPI Jonathon Gardner is a 38 y.o. male.   38 year old male with report of runny nose, cough and congestion since approximately 01/10/2024 or earlier.  He has a tendency to end up with sinus infections.  He has used over-the-counter decongestants and acetaminophen  ibuprofen and Sudafed.  Symptoms have persisted and worsened.  He is getting yellow-brown mucus when he blows his nose.  He has had persistent facial pain or sinus pressure since approximately 01/24/2024 or earlier.  He feels like it is a sinus infection and he is going to need some antibiotics because nothing else has helped.     History reviewed. No pertinent past medical history.  There are no active problems to display for this patient.   History reviewed. No pertinent surgical history.     Home Medications    Prior to Admission medications   Medication Sig Start Date End Date Taking? Authorizing Provider  doxycycline  (VIBRAMYCIN ) 100 MG capsule Take 1 capsule (100 mg total) by mouth 2 (two) times daily for 10 days. 02/06/24 02/16/24 Yes Ival Domino, FNP    Family History History reviewed. No pertinent family history.  Social History Social History   Tobacco Use   Smoking status: Never   Smokeless tobacco: Never  Vaping Use   Vaping status: Never Used  Substance Use Topics   Alcohol use: Yes    Comment: socially   Drug use: Never     Allergies   Penicillins   Review of Systems Review of Systems  Constitutional:  Negative for chills and fever.  HENT:  Positive for congestion, postnasal drip, rhinorrhea, sinus pressure and sinus pain. Negative for ear pain and sore throat.   Eyes:  Negative for pain and visual disturbance.  Respiratory:  Positive for cough.   Cardiovascular:  Negative for chest pain and palpitations.   Gastrointestinal:  Negative for abdominal pain, constipation, diarrhea, nausea and vomiting.  Genitourinary:  Negative for dysuria and hematuria.  Musculoskeletal:  Negative for arthralgias and back pain.  Skin:  Negative for color change and rash.  Neurological:  Negative for seizures and syncope.  All other systems reviewed and are negative.    Physical Exam Triage Vital Signs ED Triage Vitals  Encounter Vitals Group     BP 02/06/24 0850 128/80     Girls Systolic BP Percentile --      Girls Diastolic BP Percentile --      Boys Systolic BP Percentile --      Boys Diastolic BP Percentile --      Pulse Rate 02/06/24 0850 64     Resp 02/06/24 0850 18     Temp 02/06/24 0850 97.8 F (36.6 C)     Temp Source 02/06/24 0850 Oral     SpO2 02/06/24 0850 98 %     Weight --      Height --      Head Circumference --      Peak Flow --      Pain Score 02/06/24 0853 0     Pain Loc --      Pain Education --      Exclude from Growth Chart --    No data found.  Updated Vital Signs BP 128/80 (BP Location: Right Arm)   Pulse 64  Temp 97.8 F (36.6 C) (Oral)   Resp 18   SpO2 98%   Visual Acuity Right Eye Distance:   Left Eye Distance:   Bilateral Distance:    Right Eye Near:   Left Eye Near:    Bilateral Near:     Physical Exam Vitals and nursing note reviewed.  Constitutional:      General: He is not in acute distress.    Appearance: He is well-developed. He is not ill-appearing or toxic-appearing.  HENT:     Head: Normocephalic and atraumatic.     Right Ear: Hearing, tympanic membrane, ear canal and external ear normal.     Left Ear: Hearing, tympanic membrane, ear canal and external ear normal.     Nose: Mucosal edema, congestion and rhinorrhea present. Rhinorrhea is purulent.     Right Sinus: Maxillary sinus tenderness and frontal sinus tenderness present.     Left Sinus: Maxillary sinus tenderness and frontal sinus tenderness present.     Mouth/Throat:     Lips:  Pink.     Mouth: Mucous membranes are moist.     Pharynx: Uvula midline. No oropharyngeal exudate or posterior oropharyngeal erythema.     Tonsils: No tonsillar exudate.  Eyes:     Conjunctiva/sclera: Conjunctivae normal.     Pupils: Pupils are equal, round, and reactive to light.  Cardiovascular:     Rate and Rhythm: Normal rate and regular rhythm.     Heart sounds: S1 normal and S2 normal. No murmur heard. Pulmonary:     Effort: Pulmonary effort is normal. No respiratory distress.     Breath sounds: Normal breath sounds. No decreased breath sounds, wheezing, rhonchi or rales.  Abdominal:     General: Bowel sounds are normal.     Palpations: Abdomen is soft.     Tenderness: There is no abdominal tenderness.  Musculoskeletal:        General: No swelling.     Cervical back: Neck supple.  Lymphadenopathy:     Head:     Right side of head: No submental, submandibular, tonsillar, preauricular or posterior auricular adenopathy.     Left side of head: No submental, submandibular, tonsillar, preauricular or posterior auricular adenopathy.     Cervical: Cervical adenopathy present.     Right cervical: Superficial cervical adenopathy present.     Left cervical: Superficial cervical adenopathy present.  Skin:    General: Skin is warm and dry.     Capillary Refill: Capillary refill takes less than 2 seconds.     Findings: No rash.  Neurological:     Mental Status: He is alert and oriented to person, place, and time.  Psychiatric:        Mood and Affect: Mood normal.      UC Treatments / Results  Labs (all labs ordered are listed, but only abnormal results are displayed) Labs Reviewed - No data to display  EKG   Radiology No results found.  Procedures Procedures (including critical care time)  Medications Ordered in UC Medications  triamcinolone  acetonide (KENALOG-40) injection 40 mg (40 mg Intramuscular Given 02/06/24 0917)    Initial Impression / Assessment and Plan /  UC Course  I have reviewed the triage vital signs and the nursing notes.  Pertinent labs & imaging results that were available during my care of the patient were reviewed by me and considered in my medical decision making (see chart for details).  Plan of Care: Sinusitis with sinus pressure: Kenalog 40 mg injection  in the office (this is a steroid injection).  Doxycycline  100 mg twice daily.  Use plain Mucinex to make nasal secretions thinner.  Discontinue use of OTC Sudafed.  Get plenty of fluids and rest.  Encouraged use of sinus rinse bottle kit twice daily.  Follow-up if symptoms do not improve, worsen or new symptoms occur.  I reviewed the plan of care with the patient and/or the patient's guardian.  The patient and/or guardian had time to ask questions and acknowledged that the questions were answered.  I provided instruction on symptoms or reasons to return here or to go to an ER, if symptoms/condition did not improve, worsened or if new symptoms occurred.  Final Clinical Impressions(s) / UC Diagnoses   Final diagnoses:  Acute non-recurrent pansinusitis  Sinus pressure     Discharge Instructions      Sinusitis with sinus pressure: Kenalog 40 mg injection in the office (this is a steroid injection).  Doxycycline  100 mg twice daily.  Use plain Mucinex to make nasal secretions thinner.  Discontinue use of OTC Sudafed.  Get plenty of fluids and rest.  Encouraged use of sinus rinse bottle kit twice daily.  Follow-up if symptoms do not improve, worsen or new symptoms occur.     ED Prescriptions     Medication Sig Dispense Auth. Provider   doxycycline  (VIBRAMYCIN ) 100 MG capsule Take 1 capsule (100 mg total) by mouth 2 (two) times daily for 10 days. 20 capsule Ival Domino, FNP      PDMP not reviewed this encounter.   Ival Domino, FNP 02/06/24 [phone]
